# Patient Record
Sex: Male | Born: 1980
Health system: Southern US, Community
[De-identification: ages and names within clinical notes are randomized; demographics above are authoritative.]

---

## 2005-03-21 ENCOUNTER — Emergency Department (HOSPITAL_COMMUNITY): Admission: EM | Admit: 2005-03-21 | Discharge: 2005-03-21 | Payer: Self-pay | Admitting: Emergency Medicine

## 2006-02-23 IMAGING — CT CT HEAD W/O CM
1 series · 16 of 30 positions shown, 20 images · IV contrast (agent unspecified)
Comparison: none

CLINICAL DATA: headache
 HEAD CT WITHOUT CONTRAST:
TECHNIQUE: Contiguous axial images were obtained from the base of the skull through the vertex according to standard protocol without contrast.

[Series 2: head_seq 4.5 h45s st · axial · 0.43mm/px · z∈[-206,-62]mm · 16 of 36 slices shown, 20 images]
[im 2/36  brain]
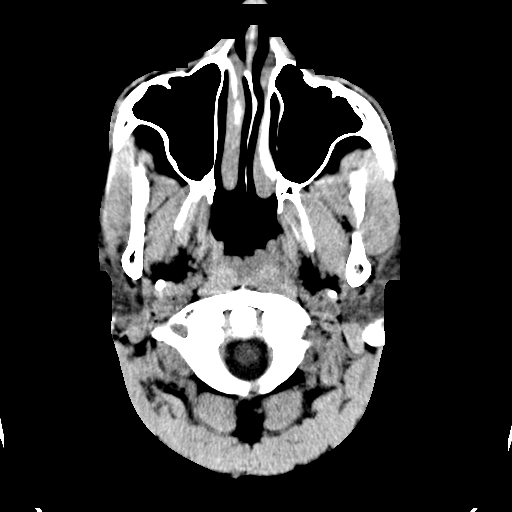
[im 2/36  bone]
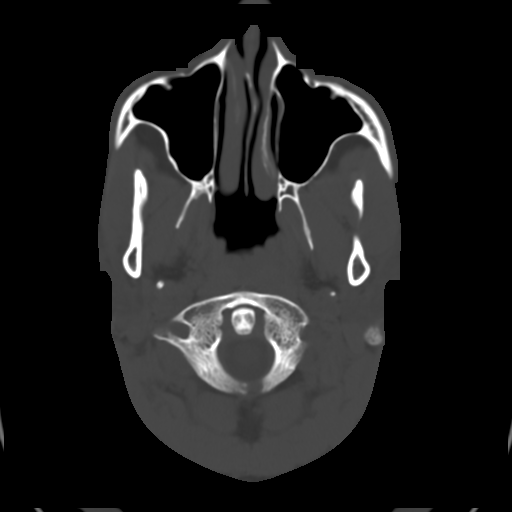
[im 4/36  brain]
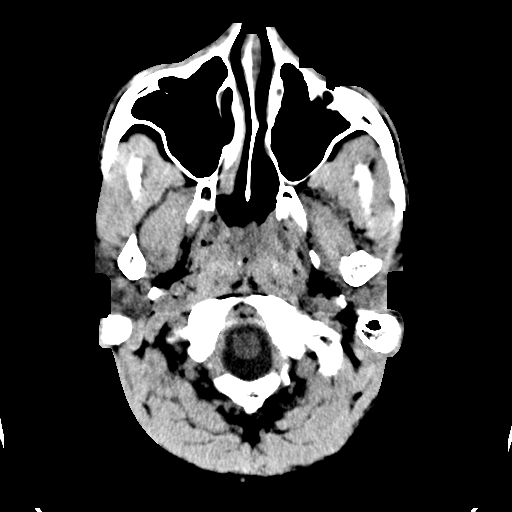
[im 7/36  brain]
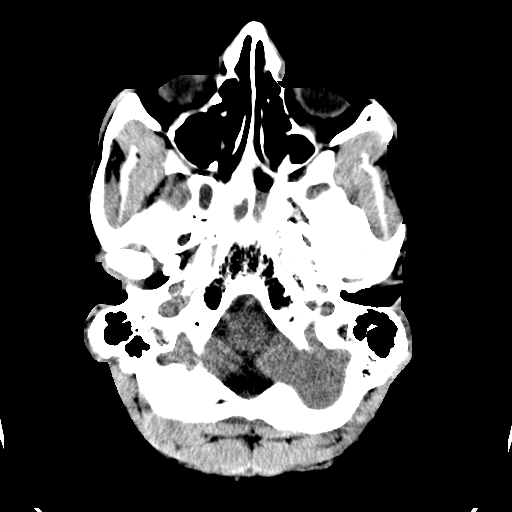
[im 9/36  brain]
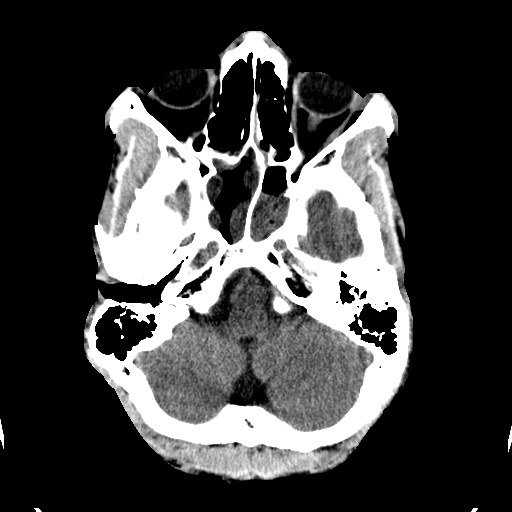
[im 10/36  brain]
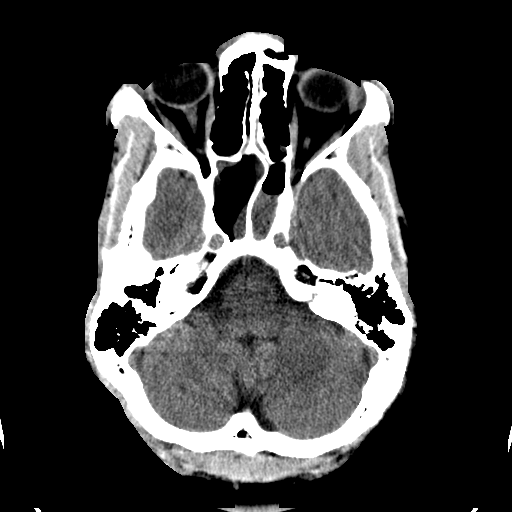
[im 10/36  bone]
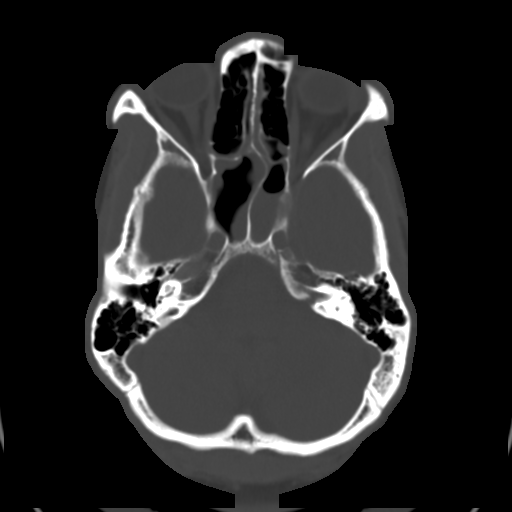
[im 13/36  brain]
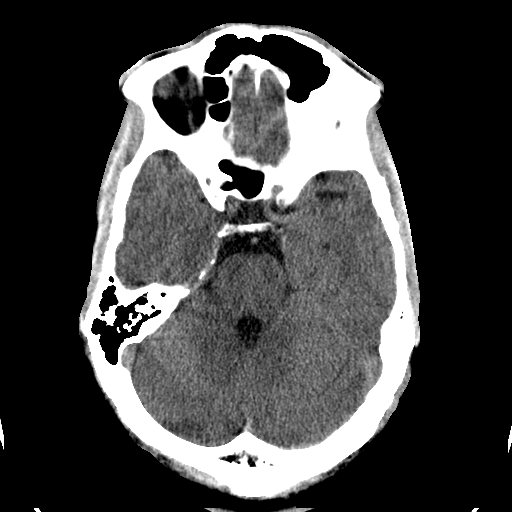
[im 15/36  brain]
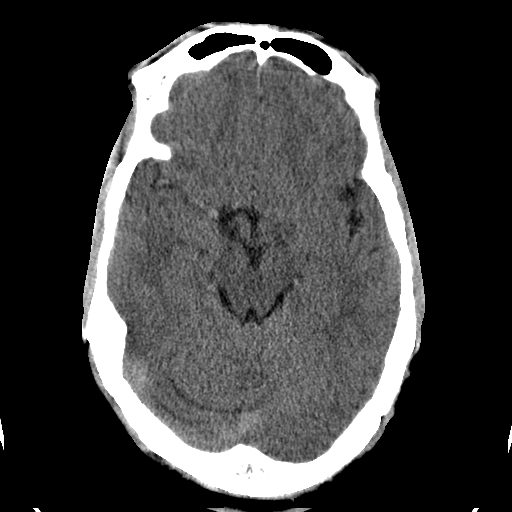
[im 17/36  brain]
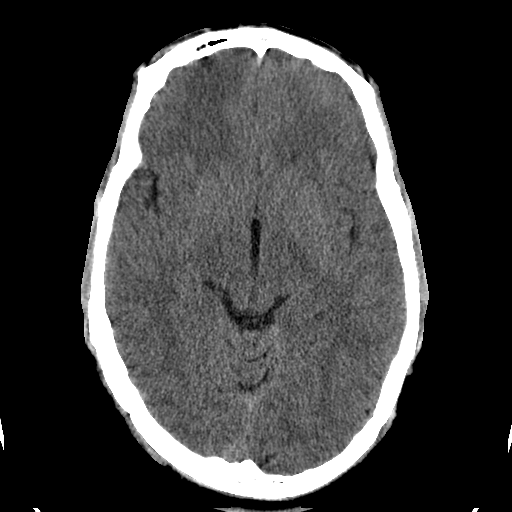
[im 19/36  brain]
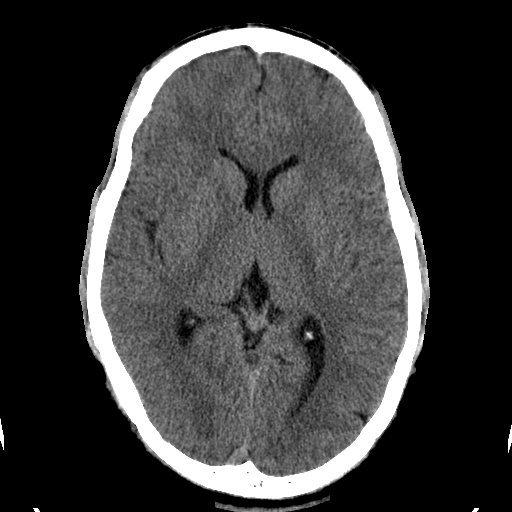
[im 19/36  bone]
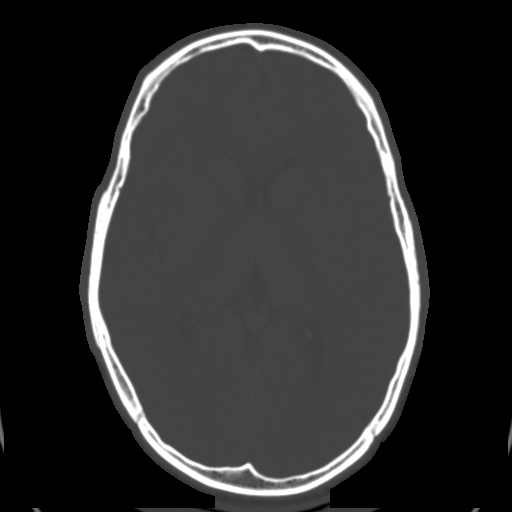
[im 21/36  brain]
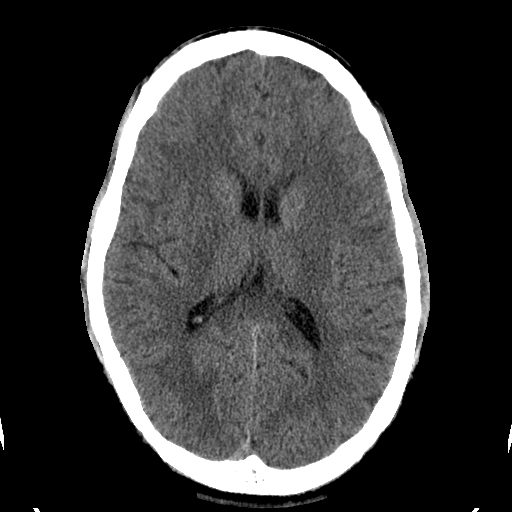
[im 23/36  brain]
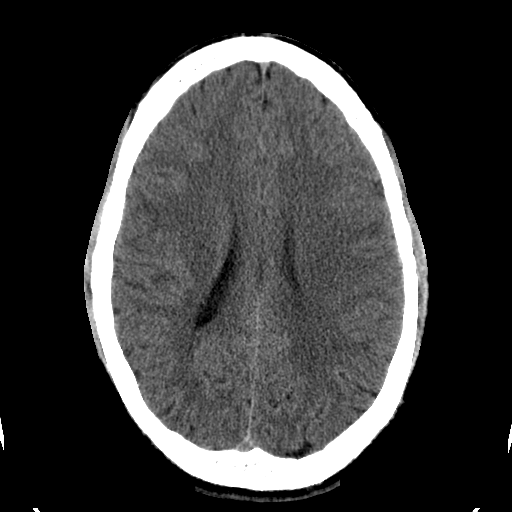
[im 26/36  brain]
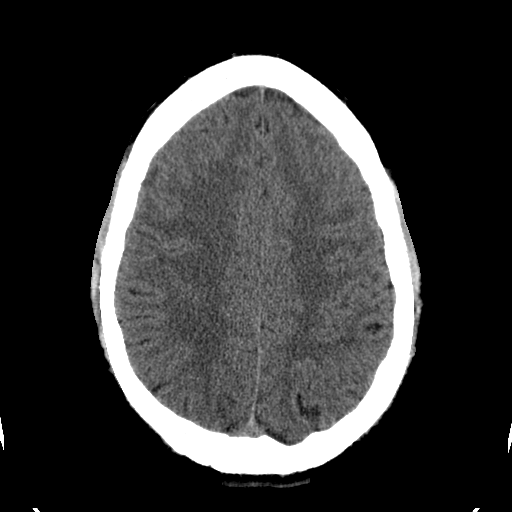
[im 27/36  brain]
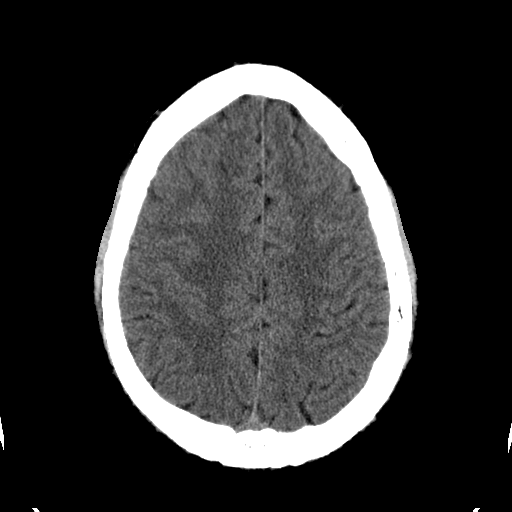
[im 27/36  bone]
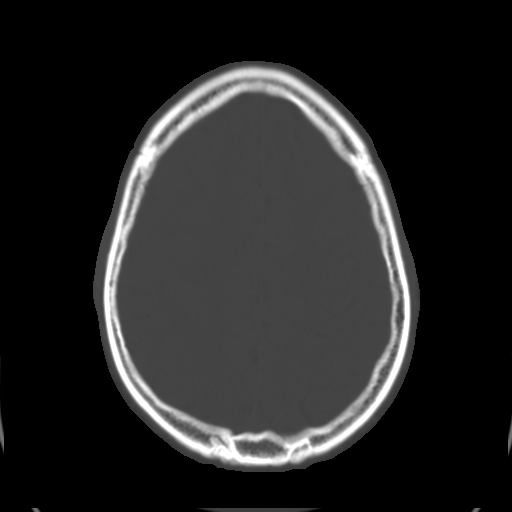
[im 29/36  brain]
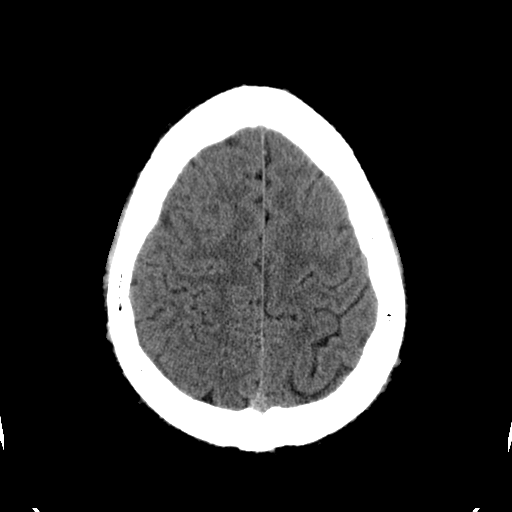
[im 32/36  brain]
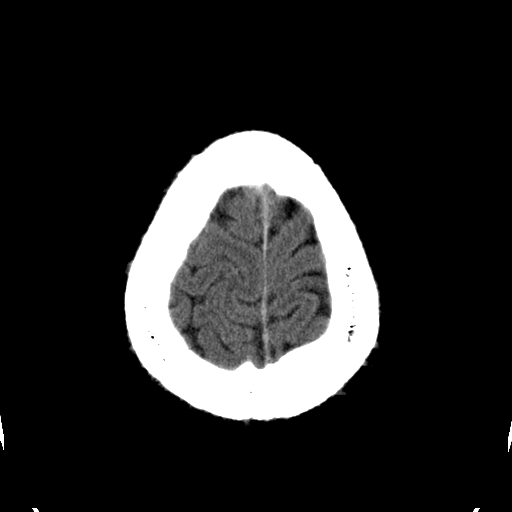
[im 34/36  brain]
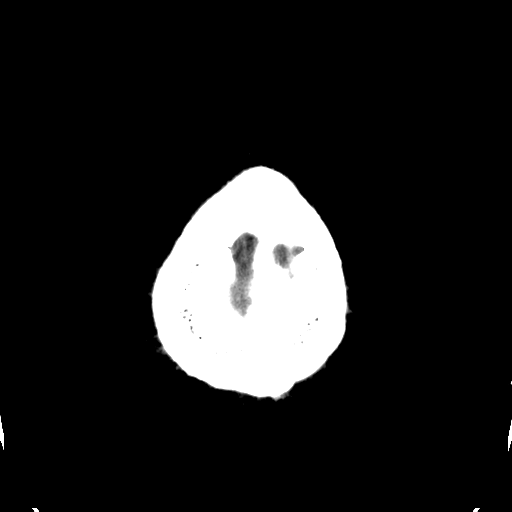

[16 of 30 positions shown; findings below may reference images not displayed]

FINDINGS: The ventricular system is normal in size and configuration and the septum is in a normal midline position.  The fourth ventricle and basilar cisterns appear normal.  No acute intracranial abnormality is seen.  No mass effect is noted.  On bone window images there is opacification of much of the sphenoid sinus with air-fluid level consistent with sphenoid sinusitis with some involvement of the posterior ethmoid air cells as well.  No bony abnormality is seen.
IMPRESSION: 1.  No acute intracranial abnormality.
 2.  Sphenoid sinusitis.

## 2016-01-13 ENCOUNTER — Ambulatory Visit (INDEPENDENT_AMBULATORY_CARE_PROVIDER_SITE_OTHER): Payer: Federal, State, Local not specified - PPO | Admitting: Physician Assistant

## 2016-01-13 VITALS — BP 128/78 | HR 67 | Resp 16 | Ht 69.75 in | Wt 204.0 lb

## 2016-01-13 DIAGNOSIS — L989 Disorder of the skin and subcutaneous tissue, unspecified: Secondary | ICD-10-CM | POA: Diagnosis not present

## 2016-01-13 NOTE — Patient Instructions (Signed)
Follow up in 6-8 weeks if not completely resolved.    . Notify the office if you experience any of the following signs of infection: Swelling, redness, pus drainage, streaking, fever >101.0 F      Warts Warts are small growths on the skin. They are common, and they are caused by a type of germ (virus). Warts can occur on many areas of the body. A person may have one wart or more than one wart. Warts can spread if you scratch a wart and then scratch normal skin. Most warts will go away over many months to a couple years. Treatments may be done if needed. HOME CARE  Apply over-the-counter and prescription medicines only as told by your doctor.  Do not apply over-the-counter wart medicines to your face or genitals before you ask your doctor if it is okay to do that.  Do not scratch or pick at a wart.  Wash your hands after you touch a wart.  Avoid shaving hair that is over a wart.  Keep all follow-up visits as told by your doctor. This is important. GET HELP IF:  Your warts do not improve after treatment.  You have redness, swelling, or pain at the site of a wart.  You have bleeding from a wart, and the bleeding does not stop when you put light pressure on the wart.  You have diabetes and you get a wart.   This information is not intended to replace advice given to you by your health care provider. Make sure you discuss any questions you have with your health care provider.   Document Released: 08/04/2010 Document Revised: 12/23/2014 Document Reviewed: 06/29/2014 Elsevier Interactive Patient Education 2016 [employer]     IF you received an x-ray today, you will receive an invoice from [facility]. Please contact [facility] at [phone] with questions or concerns regarding your invoice.   IF you received labwork today, you will receive an invoice from [facility]. Please contact [facility] at [phone] with questions or  concerns regarding your invoice.   Our billing staff will not be able to assist you with questions regarding bills from these companies.  You will be contacted with the lab results as soon as they are available. The fastest way to get your results is to activate your My Chart account. Instructions are located on the last page of this paperwork. If you have not heard from us regarding the results in 2 weeks, please contact this office.

## 2016-01-13 NOTE — Progress Notes (Signed)
   Carl Estrada  MRN: [id number] DOB: 01-Jun-1980  Subjective:  Carl Estrada is a 35 y.o. male seen in office today for a chief complaint of left hand wart removal. It was attempted to be frozen off 3 weeks ago but it did not completely go away. The wart just adjacent to it was removed with an injection method and it has completely resolved. Pt denies numbness, tingling, warmth, swelling, and pain.   Review of Systems Per HPI  There are no active problems to display for this patient.   No current outpatient prescriptions on file prior to visit.   No current facility-administered medications on file prior to visit.     No Known Allergies  Objective:  BP 128/78 (BP Location: Right Arm, Cuff Size: Normal)   Pulse 67   Resp 16   Ht 5' 9.75" (1.772 m)   Wt 204 lb (92.5 kg)   SpO2 98%   BMI 29.48 kg/m   Physical Exam  Constitutional: He is oriented to person, place, and time and well-developed, well-nourished, and in no distress.  HENT:  Head: Normocephalic and atraumatic.  Eyes: Conjunctivae are normal.  Neck: Normal range of motion.  Pulmonary/Chest: Effort normal.  Musculoskeletal:       Arms: Neurological: He is alert and oriented to person, place, and time. Gait normal.  Skin: Skin is warm and dry.  Psychiatric: Affect normal.  Vitals reviewed.   PROCEDURE NOTE: Cryotherapy Verbal consent obtained. Cleansed skin with alcohol prep pad. Cryotherapy applied (freeze-thaw-freeze) to warty lesion on dorsal aspect of left hand. The patient tolerated the procedure well.   Assessment and Plan :  1. Skin lesion -Consistent with wart -Cryotherapy performed -Wound care instructions given -Follow up in 6-8 weeks if not completely resolved  Benjiman CoreBrittany Patton Rabinovich PA-C  [facility] 01/13/2016 6:09 PM

## 2016-08-11 DIAGNOSIS — K080 Exfoliation of teeth due to systemic causes: Secondary | ICD-10-CM | POA: Diagnosis not present

## 2016-12-28 DIAGNOSIS — F3342 Major depressive disorder, recurrent, in full remission: Secondary | ICD-10-CM | POA: Diagnosis not present

## 2017-03-24 DIAGNOSIS — K080 Exfoliation of teeth due to systemic causes: Secondary | ICD-10-CM | POA: Diagnosis not present

## 2017-12-25 DIAGNOSIS — F3342 Major depressive disorder, recurrent, in full remission: Secondary | ICD-10-CM | POA: Diagnosis not present

## 2017-12-27 DIAGNOSIS — K080 Exfoliation of teeth due to systemic causes: Secondary | ICD-10-CM | POA: Diagnosis not present

## 2018-06-03 DIAGNOSIS — E663 Overweight: Secondary | ICD-10-CM | POA: Diagnosis not present

## 2018-06-03 DIAGNOSIS — Z13220 Encounter for screening for lipoid disorders: Secondary | ICD-10-CM | POA: Diagnosis not present

## 2018-06-03 DIAGNOSIS — Z0000 Encounter for general adult medical examination without abnormal findings: Secondary | ICD-10-CM | POA: Diagnosis not present

## 2018-06-03 DIAGNOSIS — Z23 Encounter for immunization: Secondary | ICD-10-CM | POA: Diagnosis not present

## 2018-09-17 DIAGNOSIS — J3089 Other allergic rhinitis: Secondary | ICD-10-CM | POA: Diagnosis not present

## 2018-09-17 DIAGNOSIS — J301 Allergic rhinitis due to pollen: Secondary | ICD-10-CM | POA: Diagnosis not present

## 2018-09-17 DIAGNOSIS — H1045 Other chronic allergic conjunctivitis: Secondary | ICD-10-CM | POA: Diagnosis not present

## 2018-09-17 DIAGNOSIS — J3081 Allergic rhinitis due to animal (cat) (dog) hair and dander: Secondary | ICD-10-CM | POA: Diagnosis not present

## 2018-12-17 DIAGNOSIS — F3342 Major depressive disorder, recurrent, in full remission: Secondary | ICD-10-CM | POA: Diagnosis not present

## 2019-02-15 ENCOUNTER — Emergency Department (HOSPITAL_COMMUNITY)
Admission: EM | Admit: 2019-02-15 | Discharge: 2019-02-16 | Disposition: A | Discharge status: Home / Self Care | Payer: Federal, State, Local not specified - PPO | Attending: Emergency Medicine | Admitting: Emergency Medicine

## 2019-02-15 ENCOUNTER — Encounter (HOSPITAL_COMMUNITY): Payer: Self-pay | Admitting: Emergency Medicine

## 2019-02-15 ENCOUNTER — Emergency Department (HOSPITAL_COMMUNITY): Payer: Federal, State, Local not specified - PPO

## 2019-02-15 ENCOUNTER — Other Ambulatory Visit: Payer: Self-pay

## 2019-02-15 DIAGNOSIS — Z79899 Other long term (current) drug therapy: Secondary | ICD-10-CM | POA: Insufficient documentation

## 2019-02-15 DIAGNOSIS — Z20828 Contact with and (suspected) exposure to other viral communicable diseases: Secondary | ICD-10-CM

## 2019-02-15 DIAGNOSIS — Z20822 Contact with and (suspected) exposure to covid-19: Secondary | ICD-10-CM

## 2019-02-15 DIAGNOSIS — U071 COVID-19: Secondary | ICD-10-CM | POA: Insufficient documentation

## 2019-02-15 DIAGNOSIS — I10 Essential (primary) hypertension: Secondary | ICD-10-CM | POA: Insufficient documentation

## 2019-02-15 DIAGNOSIS — R0789 Other chest pain: Secondary | ICD-10-CM

## 2019-02-15 DIAGNOSIS — R079 Chest pain, unspecified: Secondary | ICD-10-CM | POA: Diagnosis not present

## 2019-02-15 LAB — BASIC METABOLIC PANEL
Anion gap: 12 (ref 5–15)
BUN: 12 mg/dL (ref 6–20)
CO2: 24 mmol/L (ref 22–32)
Calcium: 9.1 mg/dL (ref 8.9–10.3)
Chloride: 103 mmol/L (ref 98–111)
Creatinine, Ser: 1.1 mg/dL (ref 0.61–1.24)
GFR calc Af Amer: >60 mL/min (ref >60)
GFR calc non Af Amer: >60 mL/min (ref >60)
Glucose, Bld: 107 mg/dL — ABNORMAL HIGH (ref 70–99)
Potassium: 3.3 mmol/L — ABNORMAL LOW (ref 3.5–5.1)
Sodium: 139 mmol/L (ref 135–145)

## 2019-02-15 LAB — CBC
HCT: 44.3 % (ref 39.0–52.0)
Hemoglobin: 14.3 g/dL (ref 13.0–17.0)
MCH: 27.2 pg (ref 26.0–34.0)
MCHC: 32.3 g/dL (ref 30.0–36.0)
MCV: 84.4 fL (ref 80.0–100.0)
Platelets: 234 10*3/uL (ref 150–400)
RBC: 5.25 MIL/uL (ref 4.22–5.81)
RDW: 13.4 % (ref 11.5–15.5)
WBC: 8.1 10*3/uL (ref 4.0–10.5)
nRBC: 0 % (ref 0.0–0.2)

## 2019-02-15 LAB — TROPONIN I (HIGH SENSITIVITY)
Troponin I (High Sensitivity): 3 ng/L (ref <18)
Troponin I (High Sensitivity): <2 ng/L (ref <18)

## 2019-02-15 MED ORDER — SODIUM CHLORIDE 0.9% FLUSH: 3.0000 mL | Freq: Once | INTRAVENOUS | Status: DC

## 2019-02-15 NOTE — ED Triage Notes (Signed)
Patient reports intermittent left chest pressure onset today , denies SOB , no nausea or diaphoresis .

## 2019-02-16 LAB — SARS CORONAVIRUS 2 (TAT 6-24 HRS): SARS Coronavirus 2: POSITIVE — AB

## 2019-02-16 NOTE — ED Notes (Signed)
Pt took all leads off. He is ready to go home. States he can pick up tylenol at the store on his way home

## 2019-02-16 NOTE — ED Provider Notes (Signed)
[facility] EMERGENCY DEPARTMENT Provider Note   CSN: [id number] Arrival date & time: 02/15/19  1938    History   Chief Complaint Chief Complaint  Patient presents with  . Chest Pain    HPI Carl Estrada is a 38 y.o. male.   The history is provided by the patient.  Chest Pain He has history of hypertension, hyperlipidemia and comes in complaining of chest pain throughout the day.  He describes a tight feeling in his chest which has waxed and waned.  It generally feels better while he is walking, and worse when he lays down.  He is unable to put a number on it but it is relatively mild.  He denies dyspnea, nausea, diaphoresis.  During the day, he has had a slight tickle in his throat but no cough.  Tonight, he noted rapid heartbeat when he laid down to go to bed.  Once again, this is better when he was walking in worse when laying still.  He has never had similar symptoms before.  He is a non-smoker and denies family history of premature coronary atherosclerosis.  He is also requesting a test for COVID-19 stating that he was near somebody with COVID-19 but not in close contact.  History reviewed. No pertinent past medical history.  There are no active problems to display for this patient.   History reviewed. No pertinent surgical history.      Home Medications    Prior to Admission medications   Medication Sig Start Date End Date Taking? Authorizing Provider  buPROPion (WELLBUTRIN XL) 150 MG 24 hr tablet Take 450 mg by mouth daily. 12/18/18  Yes [provider]  fluticasone (FLONASE) 50 MCG/ACT nasal spray Place 1 spray into both nostrils daily. 12/15/18  Yes [provider]    Family History No family history on file.  Social History Social History   Tobacco Use  . Smoking status: Never Smoker  . Smokeless tobacco: Never Used  Substance Use Topics  . Alcohol use: Yes  . Drug use: Never     Allergies   Patient has no known  allergies.   Review of Systems Review of Systems  Cardiovascular: Positive for chest pain.  All other systems reviewed and are negative.    Physical Exam Updated Vital Signs BP 128/86   Pulse 86   Temp 98.2 F (36.8 C) (Oral)   Resp 15   SpO2 98%   Physical Exam Vitals signs and nursing note reviewed.    38 year old male, resting comfortably and in no acute distress. Vital signs are normal. Oxygen saturation is 98%, which is normal. Head is normocephalic and atraumatic. PERRLA, EOMI. Oropharynx is clear. Neck is nontender and supple without adenopathy or JVD. Back is nontender and there is no CVA tenderness. Lungs are clear without rales, wheezes, or rhonchi. Chest is nontender. Heart has regular rate and rhythm without murmur. Abdomen is soft, flat, nontender without masses or hepatosplenomegaly and peristalsis is normoactive. Extremities have no cyanosis or edema, full range of motion is present. Skin is warm and dry without rash. Neurologic: Mental status is normal, cranial nerves are intact, there are no motor or sensory deficits.  ED Treatments / Results  Labs (all labs ordered are listed, but only abnormal results are displayed) Labs Reviewed  BASIC METABOLIC PANEL - Abnormal; Notable for the following components:      Result Value   Potassium 3.3 (*)    Glucose, Bld 107 (*)  All other components within normal limits  SARS CORONAVIRUS 2 (TAT 6-24 HRS)  CBC  TROPONIN I (HIGH SENSITIVITY)  TROPONIN I (HIGH SENSITIVITY)    EKG EKG Interpretation  Date/Time:  Saturday February 15 2019 19:54:35 EDT Ventricular Rate:  111 PR Interval:  138 QRS Duration: 96 QT Interval:  334 QTC Calculation: 454 R Axis:   90 Text Interpretation: Sinus tachycardia Rightward axis Borderline ECG No old tracing to compare Confirmed by Dione Booze ([id number]) on 02/15/2019 11:40:59 PM   Radiology Dg Chest 2 View  Result Date: 02/15/2019 CLINICAL DATA:  38 year old male with  chest pain. EXAM: CHEST - 2 VIEW COMPARISON:  None. FINDINGS: The heart size and mediastinal contours are within normal limits. Both lungs are clear. The visualized skeletal structures are unremarkable. IMPRESSION: No active cardiopulmonary disease. Electronically Signed   By: Elgie Collard M.D.   On: 02/15/2019 20:34    Procedures Procedures   Medications Ordered in ED Medications  sodium chloride flush (NS) 0.9 % injection 3 mL (has no administration in time range)     Initial Impression / Assessment and Plan / ED Course  I have reviewed the triage vital signs and the nursing notes.  Pertinent labs & imaging results that were available during my care of the patient were reviewed by me and considered in my medical decision making (see chart for details).  Atypical chest pain.  ECG shows no acute changes.  Old records are reviewed, and it is no relevant past visits.  Symptoms are actually improved with exertion, very unlikely to be cardiac.  Troponin is normal and chest x-ray is unremarkable.  He is felt to be safe for discharge.  Heart score is 2, which puts him at very low risk for major adverse cardiac events in the next 6 weeks.  COVID-19 test is obtained.  He is instructed to follow-up with his PCP.  Carl Estrada was evaluated in Emergency Department on 02/16/2019 for the symptoms described in the history of present illness. He was evaluated in the context of the global COVID-19 pandemic, which necessitated consideration that the patient might be at risk for infection with the SARS-CoV-2 virus that causes COVID-19. Institutional protocols and algorithms that pertain to the evaluation of patients at risk for COVID-19 are in a state of rapid change based on information released by regulatory bodies including the CDC and federal and state organizations. These policies and algorithms were followed during the patient's care in the ED.  Final Clinical Impressions(s) / ED Diagnoses   Final  diagnoses:  Atypical chest pain  Exposure to COVID-19 virus    ED Discharge Orders    None       Dione Booze, MD 02/16/19 [phone]

## 2019-03-10 DIAGNOSIS — L738 Other specified follicular disorders: Secondary | ICD-10-CM | POA: Diagnosis not present

## 2019-06-09 DIAGNOSIS — J3081 Allergic rhinitis due to animal (cat) (dog) hair and dander: Secondary | ICD-10-CM | POA: Diagnosis not present

## 2019-06-09 DIAGNOSIS — J3089 Other allergic rhinitis: Secondary | ICD-10-CM | POA: Diagnosis not present

## 2019-06-09 DIAGNOSIS — J301 Allergic rhinitis due to pollen: Secondary | ICD-10-CM | POA: Diagnosis not present

## 2019-06-09 DIAGNOSIS — H1045 Other chronic allergic conjunctivitis: Secondary | ICD-10-CM | POA: Diagnosis not present

## 2019-06-10 DIAGNOSIS — F3342 Major depressive disorder, recurrent, in full remission: Secondary | ICD-10-CM | POA: Diagnosis not present

## 2019-06-24 DIAGNOSIS — Z13220 Encounter for screening for lipoid disorders: Secondary | ICD-10-CM | POA: Diagnosis not present

## 2019-06-24 DIAGNOSIS — Z0000 Encounter for general adult medical examination without abnormal findings: Secondary | ICD-10-CM | POA: Diagnosis not present

## 2019-07-12 DIAGNOSIS — L700 Acne vulgaris: Secondary | ICD-10-CM | POA: Diagnosis not present

## 2020-01-20 IMAGING — CR DG CHEST 2V
2 series · 2 of 2 positions shown · non-contrast
Comparison: None.

CLINICAL DATA: 38-year-old male with chest pain.

EXAM:
CHEST - 2 VIEW

[chest pa]
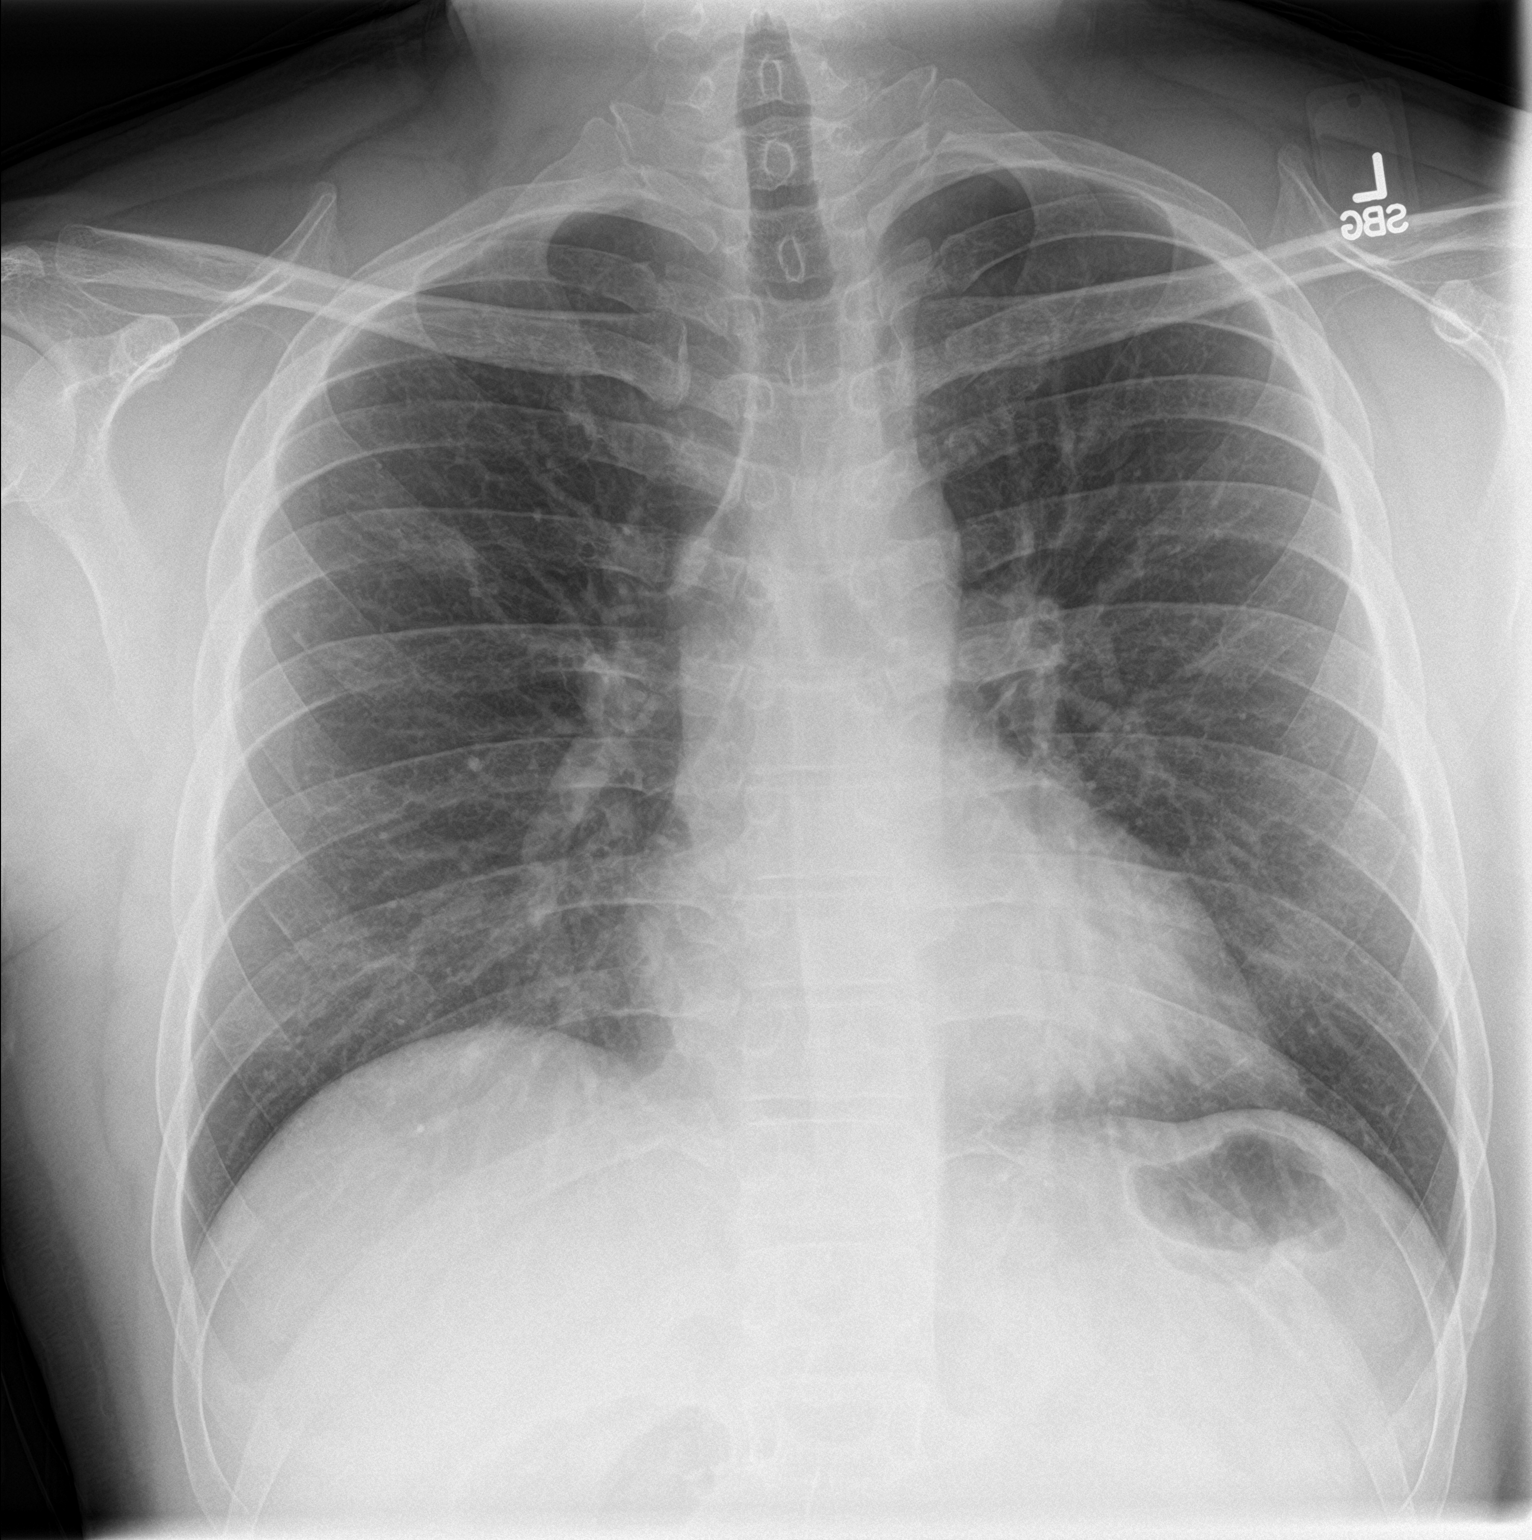

[chest lat]
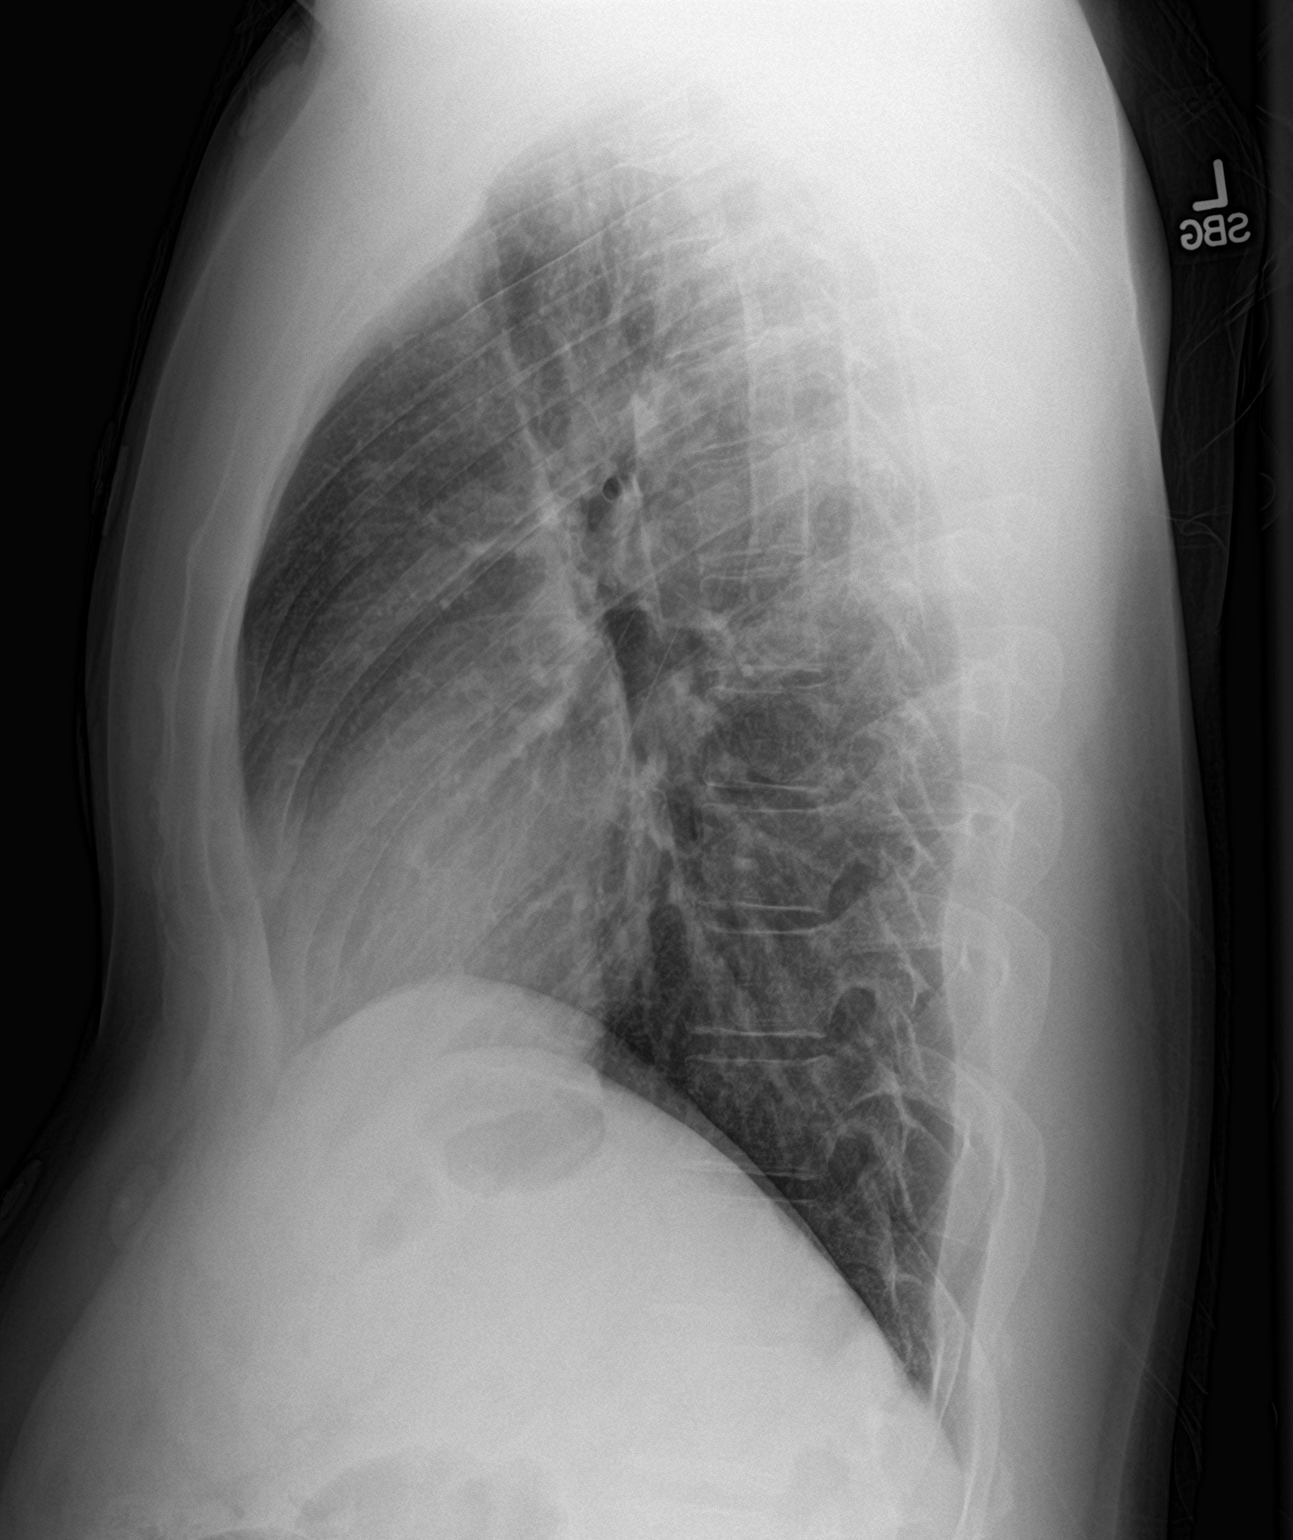

[2 of 2 positions shown; findings below may reference images not displayed]

FINDINGS: The heart size and mediastinal contours are within normal limits.
Both lungs are clear. The visualized skeletal structures are
unremarkable.
IMPRESSION: No active cardiopulmonary disease.

## 2020-06-08 DIAGNOSIS — J301 Allergic rhinitis due to pollen: Secondary | ICD-10-CM | POA: Diagnosis not present

## 2020-06-08 DIAGNOSIS — J3081 Allergic rhinitis due to animal (cat) (dog) hair and dander: Secondary | ICD-10-CM | POA: Diagnosis not present

## 2020-06-08 DIAGNOSIS — H1045 Other chronic allergic conjunctivitis: Secondary | ICD-10-CM | POA: Diagnosis not present

## 2020-06-08 DIAGNOSIS — J3089 Other allergic rhinitis: Secondary | ICD-10-CM | POA: Diagnosis not present

## 2020-06-26 DIAGNOSIS — F3342 Major depressive disorder, recurrent, in full remission: Secondary | ICD-10-CM | POA: Diagnosis not present

## 2020-11-11 DIAGNOSIS — R002 Palpitations: Secondary | ICD-10-CM | POA: Diagnosis not present

## 2020-11-11 DIAGNOSIS — Z0000 Encounter for general adult medical examination without abnormal findings: Secondary | ICD-10-CM | POA: Diagnosis not present

## 2020-11-11 DIAGNOSIS — Z13220 Encounter for screening for lipoid disorders: Secondary | ICD-10-CM | POA: Diagnosis not present

## 2020-12-01 DIAGNOSIS — R7989 Other specified abnormal findings of blood chemistry: Secondary | ICD-10-CM | POA: Diagnosis not present

## 2020-12-09 DIAGNOSIS — K080 Exfoliation of teeth due to systemic causes: Secondary | ICD-10-CM | POA: Diagnosis not present

## 2020-12-17 DIAGNOSIS — R748 Abnormal levels of other serum enzymes: Secondary | ICD-10-CM | POA: Diagnosis not present

## 2021-11-24 DIAGNOSIS — Z0000 Encounter for general adult medical examination without abnormal findings: Secondary | ICD-10-CM | POA: Diagnosis not present

## 2021-11-24 DIAGNOSIS — Z13220 Encounter for screening for lipoid disorders: Secondary | ICD-10-CM | POA: Diagnosis not present

## 2022-09-25 DIAGNOSIS — F330 Major depressive disorder, recurrent, mild: Secondary | ICD-10-CM | POA: Diagnosis not present

## 2022-10-18 DIAGNOSIS — J3081 Allergic rhinitis due to animal (cat) (dog) hair and dander: Secondary | ICD-10-CM | POA: Diagnosis not present

## 2022-10-18 DIAGNOSIS — J301 Allergic rhinitis due to pollen: Secondary | ICD-10-CM | POA: Diagnosis not present

## 2022-10-18 DIAGNOSIS — H1045 Other chronic allergic conjunctivitis: Secondary | ICD-10-CM | POA: Diagnosis not present

## 2022-10-18 DIAGNOSIS — J3089 Other allergic rhinitis: Secondary | ICD-10-CM | POA: Diagnosis not present

## 2022-11-02 DIAGNOSIS — F4323 Adjustment disorder with mixed anxiety and depressed mood: Secondary | ICD-10-CM | POA: Diagnosis not present

## 2022-11-16 DIAGNOSIS — F4323 Adjustment disorder with mixed anxiety and depressed mood: Secondary | ICD-10-CM | POA: Diagnosis not present

## 2022-12-07 DIAGNOSIS — F4323 Adjustment disorder with mixed anxiety and depressed mood: Secondary | ICD-10-CM | POA: Diagnosis not present

## 2023-01-04 DIAGNOSIS — F4323 Adjustment disorder with mixed anxiety and depressed mood: Secondary | ICD-10-CM | POA: Diagnosis not present

## 2023-01-25 DIAGNOSIS — Z0000 Encounter for general adult medical examination without abnormal findings: Secondary | ICD-10-CM | POA: Diagnosis not present

## 2023-01-25 DIAGNOSIS — F4323 Adjustment disorder with mixed anxiety and depressed mood: Secondary | ICD-10-CM | POA: Diagnosis not present

## 2023-01-25 DIAGNOSIS — Z13220 Encounter for screening for lipoid disorders: Secondary | ICD-10-CM | POA: Diagnosis not present

## 2023-01-25 DIAGNOSIS — Z6830 Body mass index (BMI) 30.0-30.9, adult: Secondary | ICD-10-CM | POA: Diagnosis not present

## 2023-02-08 DIAGNOSIS — R748 Abnormal levels of other serum enzymes: Secondary | ICD-10-CM | POA: Diagnosis not present

## 2023-02-09 ENCOUNTER — Other Ambulatory Visit: Payer: Self-pay | Admitting: Internal Medicine

## 2023-02-09 DIAGNOSIS — R7989 Other specified abnormal findings of blood chemistry: Secondary | ICD-10-CM

## 2023-02-12 DIAGNOSIS — R7989 Other specified abnormal findings of blood chemistry: Secondary | ICD-10-CM | POA: Diagnosis not present

## 2023-02-14 ENCOUNTER — Ambulatory Visit
Admission: RE | Admit: 2023-02-14 | Discharge: 2023-02-14 | Disposition: A | Discharge status: Home / Self Care | Payer: Federal, State, Local not specified - PPO | Source: Ambulatory Visit | Attending: Internal Medicine | Admitting: Internal Medicine

## 2023-02-14 DIAGNOSIS — R7989 Other specified abnormal findings of blood chemistry: Secondary | ICD-10-CM

## 2023-02-14 DIAGNOSIS — K769 Liver disease, unspecified: Secondary | ICD-10-CM | POA: Diagnosis not present

## 2023-02-15 DIAGNOSIS — F4323 Adjustment disorder with mixed anxiety and depressed mood: Secondary | ICD-10-CM | POA: Diagnosis not present

## 2023-03-08 DIAGNOSIS — F4323 Adjustment disorder with mixed anxiety and depressed mood: Secondary | ICD-10-CM | POA: Diagnosis not present

## 2023-04-05 DIAGNOSIS — F4323 Adjustment disorder with mixed anxiety and depressed mood: Secondary | ICD-10-CM | POA: Diagnosis not present

## 2023-05-02 DIAGNOSIS — F4323 Adjustment disorder with mixed anxiety and depressed mood: Secondary | ICD-10-CM | POA: Diagnosis not present

## 2023-05-23 DIAGNOSIS — F4323 Adjustment disorder with mixed anxiety and depressed mood: Secondary | ICD-10-CM | POA: Diagnosis not present

## 2023-06-13 DIAGNOSIS — F4323 Adjustment disorder with mixed anxiety and depressed mood: Secondary | ICD-10-CM | POA: Diagnosis not present

## 2023-06-27 DIAGNOSIS — F4323 Adjustment disorder with mixed anxiety and depressed mood: Secondary | ICD-10-CM | POA: Diagnosis not present

## 2023-07-18 DIAGNOSIS — F4323 Adjustment disorder with mixed anxiety and depressed mood: Secondary | ICD-10-CM | POA: Diagnosis not present

## 2023-08-08 DIAGNOSIS — F4323 Adjustment disorder with mixed anxiety and depressed mood: Secondary | ICD-10-CM | POA: Diagnosis not present

## 2023-08-29 DIAGNOSIS — F4323 Adjustment disorder with mixed anxiety and depressed mood: Secondary | ICD-10-CM | POA: Diagnosis not present

## 2023-09-17 DIAGNOSIS — F330 Major depressive disorder, recurrent, mild: Secondary | ICD-10-CM | POA: Diagnosis not present

## 2023-09-17 DIAGNOSIS — F3342 Major depressive disorder, recurrent, in full remission: Secondary | ICD-10-CM | POA: Diagnosis not present

## 2023-09-26 DIAGNOSIS — F4323 Adjustment disorder with mixed anxiety and depressed mood: Secondary | ICD-10-CM | POA: Diagnosis not present

## 2023-10-24 DIAGNOSIS — H9190 Unspecified hearing loss, unspecified ear: Secondary | ICD-10-CM | POA: Diagnosis not present

## 2023-10-24 DIAGNOSIS — J3081 Allergic rhinitis due to animal (cat) (dog) hair and dander: Secondary | ICD-10-CM | POA: Diagnosis not present

## 2023-10-24 DIAGNOSIS — J301 Allergic rhinitis due to pollen: Secondary | ICD-10-CM | POA: Diagnosis not present

## 2023-10-24 DIAGNOSIS — H6121 Impacted cerumen, right ear: Secondary | ICD-10-CM | POA: Diagnosis not present

## 2023-10-24 DIAGNOSIS — J3089 Other allergic rhinitis: Secondary | ICD-10-CM | POA: Diagnosis not present

## 2023-10-25 DIAGNOSIS — F4323 Adjustment disorder with mixed anxiety and depressed mood: Secondary | ICD-10-CM | POA: Diagnosis not present

## 2023-11-15 DIAGNOSIS — F4323 Adjustment disorder with mixed anxiety and depressed mood: Secondary | ICD-10-CM | POA: Diagnosis not present

## 2023-12-03 ENCOUNTER — Other Ambulatory Visit: Payer: Self-pay | Admitting: Medical Genetics

## 2023-12-11 ENCOUNTER — Other Ambulatory Visit

## 2023-12-11 DIAGNOSIS — Z006 Encounter for examination for normal comparison and control in clinical research program: Secondary | ICD-10-CM

## 2023-12-13 DIAGNOSIS — F4323 Adjustment disorder with mixed anxiety and depressed mood: Secondary | ICD-10-CM | POA: Diagnosis not present

## 2023-12-21 LAB — GENECONNECT MOLECULAR SCREEN: Genetic Analysis Overall Interpretation: NEGATIVE

## 2024-01-02 DIAGNOSIS — F4323 Adjustment disorder with mixed anxiety and depressed mood: Secondary | ICD-10-CM | POA: Diagnosis not present

## 2024-01-30 DIAGNOSIS — F4323 Adjustment disorder with mixed anxiety and depressed mood: Secondary | ICD-10-CM | POA: Diagnosis not present

## 2024-02-27 DIAGNOSIS — F4323 Adjustment disorder with mixed anxiety and depressed mood: Secondary | ICD-10-CM | POA: Diagnosis not present

## 2024-03-19 DIAGNOSIS — F4323 Adjustment disorder with mixed anxiety and depressed mood: Secondary | ICD-10-CM | POA: Diagnosis not present
